# Patient Record
Sex: Male | Born: 1995 | Race: Black or African American | Hispanic: No | Marital: Single | Smoking: Current some day smoker
Health system: Southern US, Community
[De-identification: ages and names within clinical notes are randomized; demographics above are authoritative.]

---

## 2016-11-18 ENCOUNTER — Emergency Department (HOSPITAL_COMMUNITY)
Admission: EM | Admit: 2016-11-18 | Discharge: 2016-11-18 | Discharge status: Against Medical Advice | Payer: Self-pay | Attending: Emergency Medicine | Admitting: Emergency Medicine

## 2016-11-18 DIAGNOSIS — J069 Acute upper respiratory infection, unspecified: Secondary | ICD-10-CM | POA: Insufficient documentation

## 2016-11-18 LAB — RAPID STREP SCREEN (MED CTR MEBANE ONLY): Streptococcus, Group A Screen (Direct): NEGATIVE

## 2016-11-18 NOTE — ED Provider Notes (Signed)
[facility]-EMERGENCY DEPT Provider Note   CSN: [id number] Arrival date & time: 11/18/16  1955  By signing my name below, I, Alyssa Grove, attest that this documentation has been prepared under the direction and in the presence of Isaac Dubie, PA-C. Electronically Signed: Alyssa Grove, ED Scribe. 11/18/16. 10:27 PM.  History   Chief Complaint Chief Complaint  Patient presents with  . Sore Throat   The history is provided by the patient. No language interpreter was used.    HPI Comments: Karl Gonzalez is a 21 y.o. male who presents to the Emergency Department complaining of gradual onset, constant, moderate sore throat for 2 days. Pain is exacerbated with swallowing. Pt reports associated nasal congestion, cough and post nasal drainage. He notes occasional redness with phlegm when spitting. He denies sinus pressure, rash, difficulty eating, difficulty sleeping or any other complaints at this time.  No past medical history on file.  There are no active problems to display for this patient.   No past surgical history on file.     Home Medications    Prior to Admission medications   Not on File    Family History No family history on file.  Social History Social History  Substance Use Topics  . Smoking status: Not on file  . Smokeless tobacco: Not on file  . Alcohol use Not on file     Allergies   Patient has no allergy information on record.   Review of Systems Review of Systems  Constitutional: Negative for appetite change and fever.  HENT: Positive for congestion and postnasal drip. Negative for sinus pressure.   Respiratory: Positive for cough.   Skin: Negative for rash.  Psychiatric/Behavioral: Negative for sleep disturbance.  All other systems reviewed and are negative.    Physical Exam Updated Vital Signs BP 124/76 (BP Location: Left Arm)   Pulse 112   Temp 98.9 F (37.2 C) (Oral)   Resp 20   SpO2 98%   Physical Exam  Constitutional: He is  oriented to person, place, and time. He appears well-developed and well-nourished. He is active. No distress.  HENT:  Head: Normocephalic and atraumatic.  Oropharynx erythematous, no exudate. Uvula midline  Eyes: Conjunctivae are normal.  Cardiovascular: Normal rate.   Pulmonary/Chest: Effort normal. No respiratory distress.  Lungs are clear  Musculoskeletal: Normal range of motion.  Neurological: He is alert and oriented to person, place, and time.  Skin: Skin is warm and dry.  Psychiatric: He has a normal mood and affect. His behavior is normal.  Nursing note and vitals reviewed.    ED Treatments / Results  DIAGNOSTIC STUDIES: Oxygen Saturation is 98% on RA, normal by my interpretation.    COORDINATION OF CARE:   Labs (all labs ordered are listed, but only abnormal results are displayed) Labs Reviewed  RAPID STREP SCREEN (NOT AT [facility])  CULTURE, GROUP A STREP [facility])    EKG  EKG Interpretation None       Radiology No results found.  Procedures Procedures (including critical care time)  Medications Ordered in ED Medications - No data to display   Initial Impression / Assessment and Plan / ED Course  I have reviewed the triage vital signs and the nursing notes.  Pertinent labs & imaging results that were available during my care of the patient were reviewed by me and considered in my medical decision making (see chart for details).     Pt with sore throat, nasal congestion, mild cough. No evidence of peritonsillar abscess.  He is non toxic appearing. Symptoms and exam consistent with viral pharyngitis. Pt was examined by PA student, I did not have a chance to examine this pt, he eloped after he was told his trep was negative.    Vitals:   11/18/16 2000  BP: 124/76  Pulse: 112  Resp: 20  Temp: 98.9 F (37.2 C)  TempSrc: Oral  SpO2: 98%     Final Clinical Impressions(s) / ED Diagnoses   Final diagnoses:  Upper respiratory tract infection, unspecified  type    New Prescriptions There are no discharge medications for this patient.    Jaynie Crumbleatyana Zabrina Brotherton, PA-C 11/18/16 2239    Alvira MondayErin Schlossman, MD 11/26/16 [phone]

## 2016-11-18 NOTE — ED Notes (Signed)
Pt has had a sore throat for 2 days. Pain when swallowing, some redness in mucous when spitting this morning. Pt has had strep in the past.

## 2016-11-18 NOTE — ED Notes (Signed)
Pt not found in room.

## 2016-11-18 NOTE — ED Notes (Signed)
Pt not found in room. Provider notified.

## 2016-11-22 LAB — CULTURE, GROUP A STREP (THRC)

## 2016-12-13 ENCOUNTER — Emergency Department (HOSPITAL_COMMUNITY): Admission: EM | Admit: 2016-12-13 | Discharge: 2016-12-13 | Discharge status: Against Medical Advice | Payer: Self-pay

## 2016-12-13 NOTE — ED Notes (Signed)
Called for triage no response 

## 2016-12-23 ENCOUNTER — Emergency Department (HOSPITAL_COMMUNITY): Payer: No Typology Code available for payment source

## 2016-12-23 ENCOUNTER — Encounter (HOSPITAL_COMMUNITY): Payer: Self-pay | Admitting: *Deleted

## 2016-12-23 ENCOUNTER — Emergency Department (HOSPITAL_COMMUNITY)
Admission: EM | Admit: 2016-12-23 | Discharge: 2016-12-24 | Disposition: A | Discharge status: Home / Self Care | Payer: No Typology Code available for payment source | Attending: Emergency Medicine | Admitting: Emergency Medicine

## 2016-12-23 DIAGNOSIS — Y999 Unspecified external cause status: Secondary | ICD-10-CM | POA: Insufficient documentation

## 2016-12-23 DIAGNOSIS — F17200 Nicotine dependence, unspecified, uncomplicated: Secondary | ICD-10-CM | POA: Diagnosis not present

## 2016-12-23 DIAGNOSIS — M25512 Pain in left shoulder: Secondary | ICD-10-CM | POA: Insufficient documentation

## 2016-12-23 DIAGNOSIS — G8929 Other chronic pain: Secondary | ICD-10-CM | POA: Insufficient documentation

## 2016-12-23 DIAGNOSIS — Y92410 Unspecified street and highway as the place of occurrence of the external cause: Secondary | ICD-10-CM | POA: Insufficient documentation

## 2016-12-23 DIAGNOSIS — Y939 Activity, unspecified: Secondary | ICD-10-CM | POA: Insufficient documentation

## 2016-12-23 DIAGNOSIS — M25511 Pain in right shoulder: Secondary | ICD-10-CM
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
## 2016-12-24 MED ORDER — MELOXICAM 15 MG PO TABS: 15.0000 mg | ORAL_TABLET | Freq: Every day | ORAL | 0 refills | Status: AC
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
## 2016-12-24 NOTE — ED Notes (Signed)
EDP at bedside  
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
## 2017-04-09 ENCOUNTER — Emergency Department (HOSPITAL_COMMUNITY): Payer: No Typology Code available for payment source

## 2017-04-09 ENCOUNTER — Encounter (HOSPITAL_COMMUNITY): Payer: Self-pay

## 2017-04-09 ENCOUNTER — Emergency Department (HOSPITAL_COMMUNITY)
Admission: EM | Admit: 2017-04-09 | Discharge: 2017-04-09 | Disposition: A | Discharge status: Home / Self Care | Payer: No Typology Code available for payment source | Attending: Emergency Medicine | Admitting: Emergency Medicine

## 2017-04-09 DIAGNOSIS — Z79899 Other long term (current) drug therapy: Secondary | ICD-10-CM | POA: Diagnosis not present

## 2017-04-09 DIAGNOSIS — S9031XA Contusion of right foot, initial encounter: Secondary | ICD-10-CM | POA: Diagnosis not present

## 2017-04-09 DIAGNOSIS — Y929 Unspecified place or not applicable: Secondary | ICD-10-CM | POA: Insufficient documentation

## 2017-04-09 DIAGNOSIS — W228XXA Striking against or struck by other objects, initial encounter: Secondary | ICD-10-CM | POA: Insufficient documentation

## 2017-04-09 DIAGNOSIS — Y9389 Activity, other specified: Secondary | ICD-10-CM | POA: Insufficient documentation

## 2017-04-09 DIAGNOSIS — S99921A Unspecified injury of right foot, initial encounter: Secondary | ICD-10-CM | POA: Diagnosis present

## 2017-04-09 DIAGNOSIS — F17210 Nicotine dependence, cigarettes, uncomplicated: Secondary | ICD-10-CM | POA: Insufficient documentation

## 2017-04-09 DIAGNOSIS — Y999 Unspecified external cause status: Secondary | ICD-10-CM | POA: Insufficient documentation

## 2017-04-09 NOTE — ED Notes (Signed)
Called for triage no answer  
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
## 2017-04-09 NOTE — ED Triage Notes (Signed)
Name called for triage x 1 - no answer

## 2017-04-09 NOTE — Discharge Instructions (Addendum)
You can take Tylenol or Ibuprofen as directed for pain.  Follow the RICE (Rest, Ice, Compression, Elevation) protocol as directed.   Wear the post-op shoe for support and stabilization.   Follow-up with your primary care doctor in 24-48 hours for further evaluation.   Return to the Emergency Department immediately if you experiencing any worsening foot pain, discoloration of the foot, numbness/weakness of your toes, coldness of the foot, or any other worsening or concerning symptoms.
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
